# Patient Record
Sex: Female | Born: 1940 | Race: White | Hispanic: No | Smoking: Never smoker
Health system: Southern US, Community
[De-identification: ages and names within clinical notes are randomized; demographics above are authoritative.]

## PROBLEM LIST (undated history)

## (undated) DIAGNOSIS — I10 Essential (primary) hypertension: Secondary | ICD-10-CM

## (undated) HISTORY — PX: ABDOMINAL HYSTERECTOMY: SHX81

## (undated) HISTORY — PX: REPLACEMENT TOTAL KNEE BILATERAL: SUR1225

## (undated) HISTORY — PX: CHOLECYSTECTOMY: SHX55

## (undated) HISTORY — PX: APPENDECTOMY: SHX54

---

## 2010-02-24 ENCOUNTER — Emergency Department (HOSPITAL_BASED_OUTPATIENT_CLINIC_OR_DEPARTMENT_OTHER)
Admission: EM | Admit: 2010-02-24 | Discharge: 2010-02-24 | Payer: Self-pay | Source: Home / Self Care | Admitting: Emergency Medicine

## 2010-05-31 LAB — CBC
MCH: 30 pg (ref 26.0–34.0)
RBC: 4.36 MIL/uL (ref 3.87–5.11)

## 2010-05-31 LAB — BASIC METABOLIC PANEL
BUN: 15 mg/dL (ref 6–23)
Calcium: 9.1 mg/dL (ref 8.4–10.5)
Chloride: 106 mEq/L (ref 96–112)
GFR calc Af Amer: >60 mL/min (ref >60)
Glucose, Bld: 132 mg/dL — ABNORMAL HIGH (ref 70–99)
Potassium: 4 mEq/L (ref 3.5–5.1)
Sodium: 144 mEq/L (ref 135–145)

## 2021-03-20 ENCOUNTER — Other Ambulatory Visit: Payer: Self-pay

## 2021-03-20 ENCOUNTER — Emergency Department (HOSPITAL_BASED_OUTPATIENT_CLINIC_OR_DEPARTMENT_OTHER)
Admission: EM | Admit: 2021-03-20 | Discharge: 2021-03-20 | Disposition: A | Discharge status: Home / Self Care | Payer: Medicare HMO | Attending: Emergency Medicine | Admitting: Emergency Medicine

## 2021-03-20 ENCOUNTER — Encounter (HOSPITAL_BASED_OUTPATIENT_CLINIC_OR_DEPARTMENT_OTHER): Payer: Self-pay | Admitting: *Deleted

## 2021-03-20 ENCOUNTER — Emergency Department (HOSPITAL_BASED_OUTPATIENT_CLINIC_OR_DEPARTMENT_OTHER): Payer: Medicare HMO

## 2021-03-20 DIAGNOSIS — M545 Low back pain, unspecified: Secondary | ICD-10-CM | POA: Diagnosis present

## 2021-03-20 DIAGNOSIS — M48061 Spinal stenosis, lumbar region without neurogenic claudication: Secondary | ICD-10-CM | POA: Insufficient documentation

## 2021-03-20 DIAGNOSIS — M5126 Other intervertebral disc displacement, lumbar region: Secondary | ICD-10-CM | POA: Diagnosis not present

## 2021-03-20 DIAGNOSIS — M5136 Other intervertebral disc degeneration, lumbar region: Secondary | ICD-10-CM | POA: Diagnosis not present

## 2021-03-20 HISTORY — DX: Essential (primary) hypertension: I10

## 2021-03-20 MED ORDER — PREDNISONE 10 MG (21) PO TBPK: ORAL_TABLET | Freq: Every day | ORAL | 0 refills | Status: AC

## 2021-03-20 MED ORDER — TRAMADOL HCL 50 MG PO TABS: 50.0000 mg | ORAL_TABLET | Freq: Four times a day (QID) | ORAL | 0 refills | Status: AC | PRN

## 2021-03-20 MED ORDER — TRAMADOL HCL 50 MG PO TABS
50.0000 mg | ORAL_TABLET | Freq: Once | ORAL | Status: AC
  Administered 2021-03-20: 50 mg via ORAL
  Filled 2021-03-20: qty 1

## 2021-03-20 MED ORDER — PREDNISONE 50 MG PO TABS
60.0000 mg | ORAL_TABLET | Freq: Once | ORAL | Status: AC
  Administered 2021-03-20: 60 mg via ORAL
  Filled 2021-03-20: qty 1

## 2021-03-20 MED ORDER — LIDOCAINE 5 % EX PTCH
1.0000 | MEDICATED_PATCH | CUTANEOUS | Status: DC
  Administered 2021-03-20: 1 via TRANSDERMAL
  Filled 2021-03-20: qty 1

## 2021-03-20 NOTE — ED Notes (Signed)
Pt ambulated to CT with staff

## 2021-03-20 NOTE — ED Triage Notes (Signed)
Pt is here for lower back pain (pain in right buttock with sharp shooting pain down right leg)

## 2021-03-20 NOTE — Discharge Instructions (Signed)
Please pick up medication and take as prescribed. I have also sent home narcotic pain medication to take as needed. I would recommend taking Tylenol for pain and to take the narcotic for breakthrough pain.   You can also buy OTC Salon Pas patches to apply to your back.   Follow up with your PCP for further evaluation of your back pain/findings on CT scan today.   Return to the ED for any new/worsening symptoms

## 2021-03-20 NOTE — ED Provider Notes (Signed)
[facility] EMERGENCY DEPARTMENT Provider Note   CSN: [id number] Arrival date & time: 03/20/21  1232     History  Chief Complaint  Patient presents with   Back Pain    Dana Boyer is a 81 y.o. female who presents to the ED today with complaint of gradual onset, constant, achy, diffuse lower back pain x 1 week.  Patient mentions that about 2 weeks ago she began experiencing some atraumatic right-sided hip pain.  She saw her PCP who did an x-ray without any acute findings and discharge her home with a short course of prednisone which she finished about a week ago.  She states that she is no longer having pain in her hip however now is experiencing pain across her lower back.  She is unsure if it is related to the prednisone use.  She denies any traumas or falls.  She states she has had to use a cane to ambulate as she feels like she could fall at any minute with her back pain.  She does report pain is exacerbated with prolonged sitting and prolonged standing.  She does feel a catching sensation at times with certain movements as well.  She denies any radiation of pain down her legs.  She denies any fevers, chills, urinary retention, urinary or bowel incontinence, saddle anesthesia, any other associated symptoms.  The history is provided by the patient, a relative and medical records.      Home Medications Prior to Admission medications   Medication Sig Start Date End Date Taking? Authorizing Provider  predniSONE (STERAPRED UNI-PAK 21 TAB) 10 MG (21) TBPK tablet Take by mouth daily. Follow package insert 03/20/21  Yes Dairon Procter, PA-C  traMADol (ULTRAM) 50 MG tablet Take 1 tablet (50 mg total) by mouth every 6 (six) hours as needed. 03/20/21  Yes Hyman Hopes, Tyeisha Dinan, PA-C      Allergies    Patient has no known allergies.    Review of Systems   Review of Systems  Constitutional:  Negative for chills and fever.  Genitourinary:  Negative for difficulty urinating, dysuria and  frequency.  Musculoskeletal:  Positive for back pain.  Neurological:  Negative for weakness and numbness.  All other systems reviewed and are negative.  Physical Exam Updated Vital Signs BP (!) 141/91 (BP Location: Left Arm)    Pulse 95    Temp 97.6 F (36.4 C) (Oral)    Resp 16    SpO2 98%  Physical Exam Vitals and nursing note reviewed.  Constitutional:      Appearance: She is not ill-appearing or diaphoretic.  HENT:     Head: Normocephalic and atraumatic.  Eyes:     Conjunctiva/sclera: Conjunctivae normal.  Cardiovascular:     Rate and Rhythm: Normal rate and regular rhythm.     Pulses: Normal pulses.  Pulmonary:     Effort: Pulmonary effort is normal.     Breath sounds: Normal breath sounds. No wheezing, rhonchi or rales.  Abdominal:     Palpations: Abdomen is soft.     Tenderness: There is no abdominal tenderness.  Musculoskeletal:     Cervical back: Neck supple.     Comments: No C or T midline spinal TTP. + Mild midline lumbar spinal TTP with associated left paralumbar musculature TTP. ROM intact to neck and back. Strength 5/5 to BUE and BLEs. Sensation intact throughout. 2+ patellar reflexes bilaterally. Negative SLR bilaterally. 2+ PT pulses bilaterally.   Skin:    General: Skin is warm and dry.  Neurological:     Mental Status: She is alert.    ED Results / Procedures / Treatments   Labs (all labs ordered are listed, but only abnormal results are displayed) Labs Reviewed - No data to display  EKG None  Radiology CT Lumbar Spine Wo Contrast  Result Date: 03/20/2021 CLINICAL DATA:  Low back pain. EXAM: CT LUMBAR SPINE WITHOUT CONTRAST TECHNIQUE: Multidetector CT imaging of the lumbar spine was performed without intravenous contrast administration. Multiplanar CT image reconstructions were also generated. COMPARISON:  None. FINDINGS: Segmentation: There are five lumbar type vertebral bodies. The last full intervertebral disc space is labeled L5-S1. Alignment: Mild  degenerative anterolisthesis of L4. The other vertebral bodies are normally aligned. Moderate to advanced facet disease but no pars defects. Vertebrae: Age related osteoporosis but no bone lesions or fractures. Hypertrophic changes involving the spinous processes. Paraspinal and other soft tissues: No significant paraspinal or retroperitoneal findings. Advanced vascular calcifications but no aneurysm. Disc levels: L1-2: No significant findings. L2-3: No significant findings. L3-4: Diffuse bulging degenerated annulus and central osteophytic spurring along with advanced facet disease and ligamentum flavum thickening contributing to moderate spinal bilateral lateral recess stenosis. There is also a broad-based rim calcified disc protrusion on the right side possibly affecting the right L3 nerve root. L4-5: Severe facet disease, left greater than right and evidence of prior laminectomy. Bulging degenerated annulus and osteophytic spurring contributing to mild bilateral lateral recess stenosis and mild bilateral foraminal stenosis. L5-S1: Severe facet disease, left greater than right. Prior laminectomy. No significant spinal or foraminal stenosis. IMPRESSION: 1. 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 2. Moderate spinal and bilateral lateral recess stenosis at L3-4 along with a broad-based rim calcified disc protrusion on the right side likely affecting the right L3 nerve root. 3. Mild bilateral lateral recess stenosis and mild bilateral foraminal stenosis at L4-5. 4. Prior laminectomy at L4-5 and L5-S1. Aortic Atherosclerosis (ICD10-I70.0). Electronically Signed   By: Rudie Meyer M.D.   On: 03/20/2021 13:31    Procedures Procedures    Medications Ordered in ED Medications  lidocaine (LIDODERM) 5 % 1 patch (1 patch Transdermal Patch Applied 03/20/21 1319)  predniSONE (DELTASONE) tablet 60 mg (has no administration in time range)  traMADol (ULTRAM) tablet  50 mg (50 mg Oral Given 03/20/21 1317)    ED Course/ Medical Decision Making/ A&P                           Medical Decision Making 59 female who presents to the ED today with complaint of atraumatic lower back pain for the past week.  Recently treated for atraumatic right hip pain with course of steroids that she finished a week ago.  On arrival to the ED today vitals are stable.  Patient appears to be no acute distress.  She is sitting upright in bed talking to family member on exam.  She is noted to have some midline lumbar spinal tenderness palpation with associated left paralumbar musculature tenderness palpation however states that at times the pain is diffusely across her lower back.  She denies any radiation of pain.  She is neurovascularly intact throughout.  She has negative straight leg raise bilaterally.  She has 2+ PT pulses bilaterally and 2+ patellar reflexes bilaterally.  She has no red flag symptoms today concerning for cauda equina, spinal epidural abscess, AAA.  Given age and increased risk fracture we will plan to start with CT  lumbar spine for further evaluation with potential for possible compression fracture versus herniated disc versus other finding.  We will treat with Lidoderm patch and tramadol and reevaluate patient.  We unfortunately do not have MRI here at this time however given mild symptoms I have lower suspicion that patient needs MRI today.  Her symptoms do appear more musculature in nature.  Question if she is having some muscle soreness related to her recent right hip pain.   Pt to be discharged home with additional course of prednisone, tramadol PRN for pain, and PCP follow up. Pt and daughter are in agreement with plan and stable for discharge home. Discussed with attending physician Dr. Charm BargesButler who agrees with plan.   Amount and/or Complexity of Data Reviewed Radiology: ordered.    Details: CT scan positive for DDD, stenosis, and herniated disc. Given reassuring  physical exam I do not feel pt requires additional imaging at this time.           Final Clinical Impression(s) / ED Diagnoses Final diagnoses:  Acute bilateral low back pain without sciatica  DDD (degenerative disc disease), lumbar  Spinal stenosis of lumbar region without neurogenic claudication  Lumbar herniated disc    Rx / DC Orders ED Discharge Orders          Ordered    predniSONE (STERAPRED UNI-PAK 21 TAB) 10 MG (21) TBPK tablet  Daily        03/20/21 1357    traMADol (ULTRAM) 50 MG tablet  Every 6 hours PRN        03/20/21 1357             Discharge Instructions      Please pick up medication and take as prescribed. I have also sent home narcotic pain medication to take as needed. I would recommend taking Tylenol for pain and to take the narcotic for breakthrough pain.   You can also buy OTC Salon Pas patches to apply to your back.   Follow up with your PCP for further evaluation of your back pain/findings on CT scan today.   Return to the ED for any new/worsening symptoms       Tanda RockersVenter, Siniyah Evangelist, Cordelia Poche-C 03/20/21 1358    Terrilee FilesButler, Michael C, MD 03/20/21 [phone]

## 2022-11-07 IMAGING — CT CT L SPINE W/O CM
3 of 5 series · 11 of 35 positions shown, 13 images · non-contrast
Comparison: None.

CLINICAL DATA: Low back pain.

EXAM:
CT LUMBAR SPINE WITHOUT CONTRAST
TECHNIQUE: Multidetector CT imaging of the lumbar spine was performed without
intravenous contrast administration. Multiplanar CT image
reconstructions were also generated.

[Series 7: coronal bone · coronal · 0.45mm/px · 3 of 91 slices shown]
[im 19/91  bone]
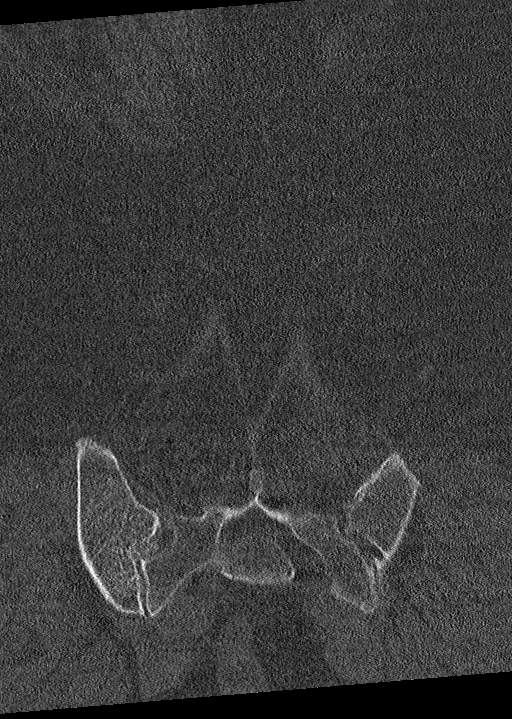
[im 37/91  bone]
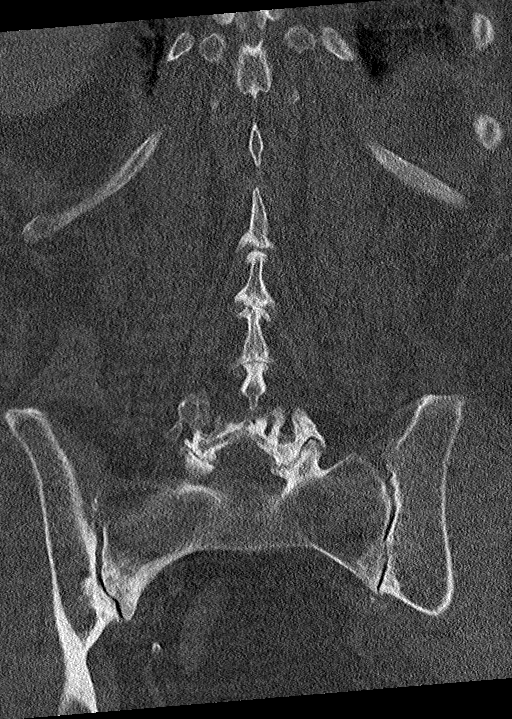
[im 55/91  bone]
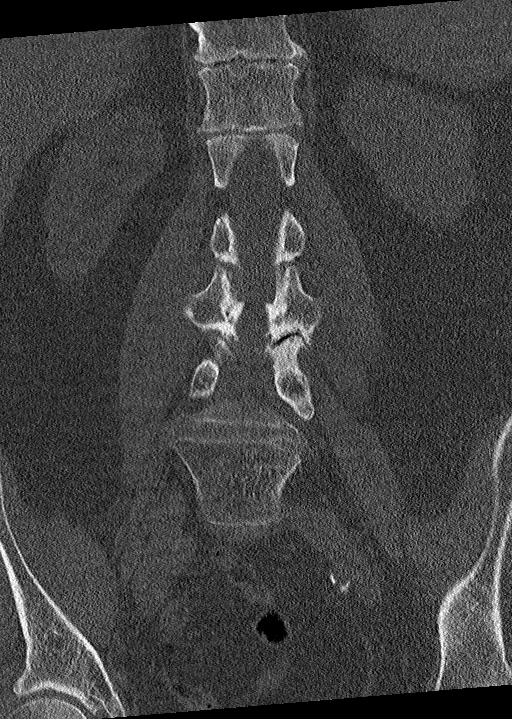

[Series 8: sagittal soft · sagittal · 0.45mm/px · 5 of 87 slices shown, 6 images]
[im 29/87  bone]
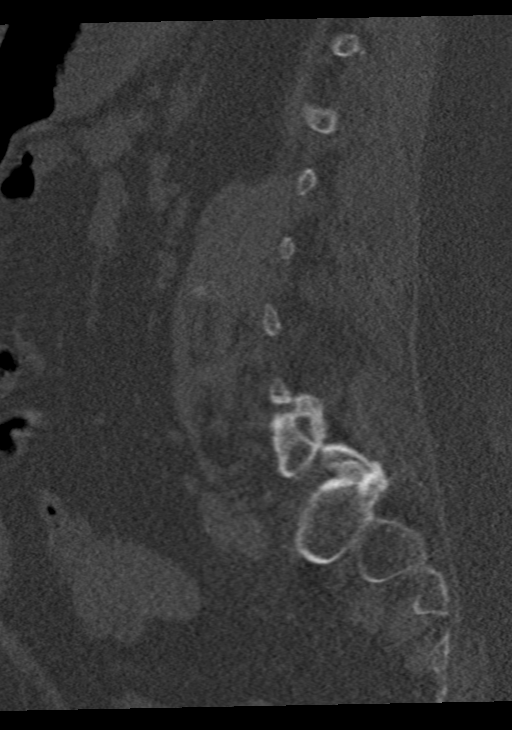
[im 36/87  bone]
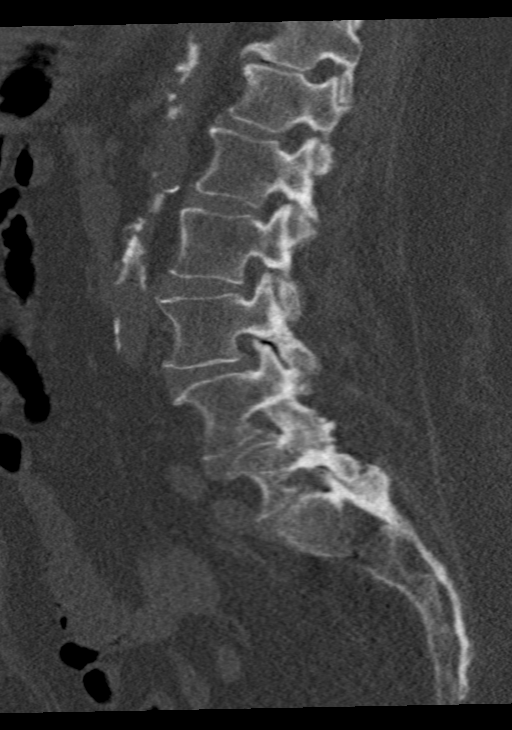
[im 44/87  soft-tissue]
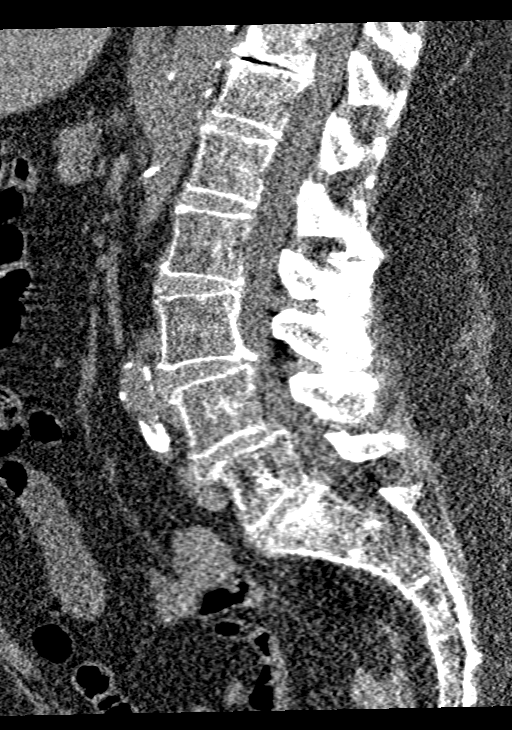
[im 44/87  bone]
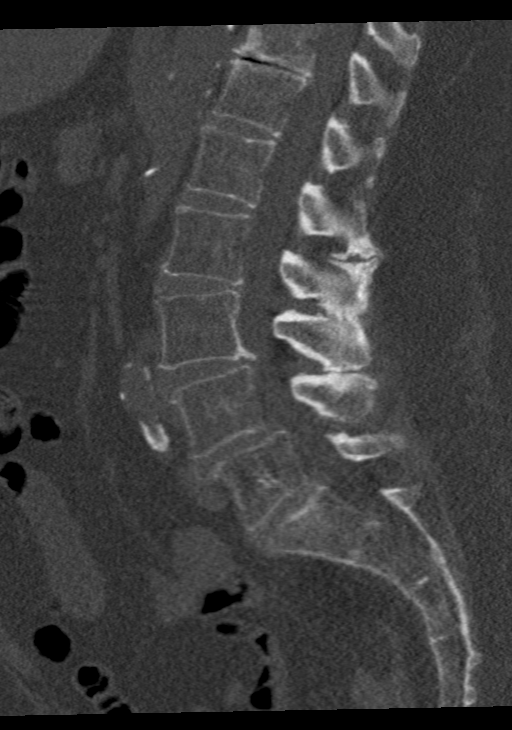
[im 51/87  bone]
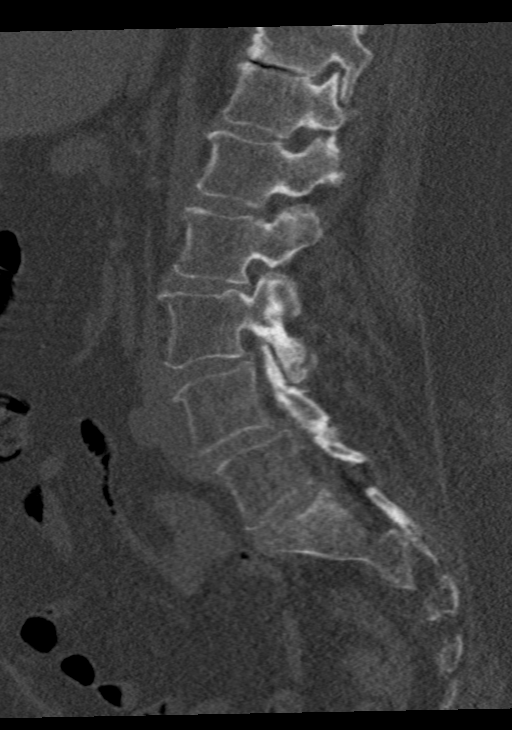
[im 58/87  bone]
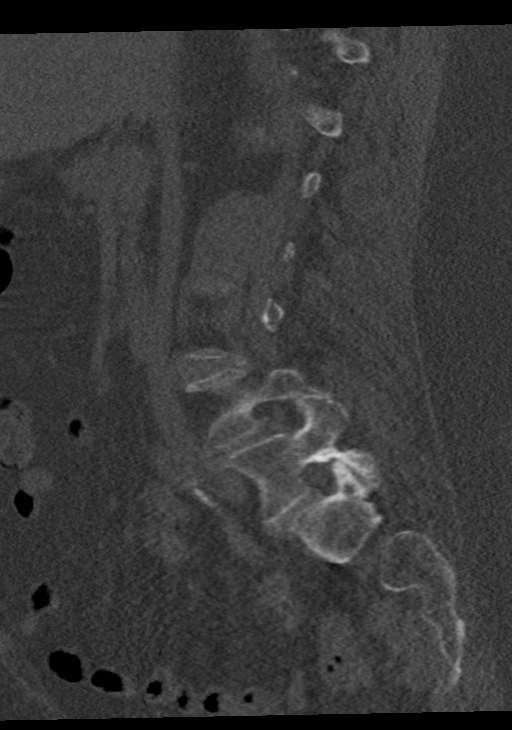

[Series 9: orthogonal axials bone · axial · 0.30mm/px · z∈[-170,-20]mm · 3 of 159 slices shown, 4 images]
[im 40/159  soft-tissue]
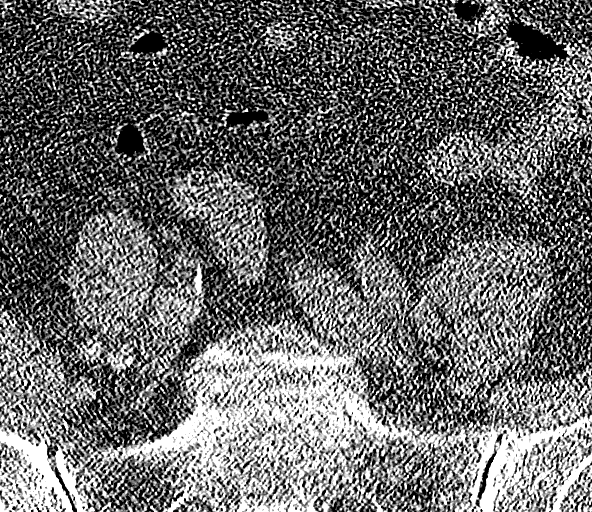
[im 40/159  bone]
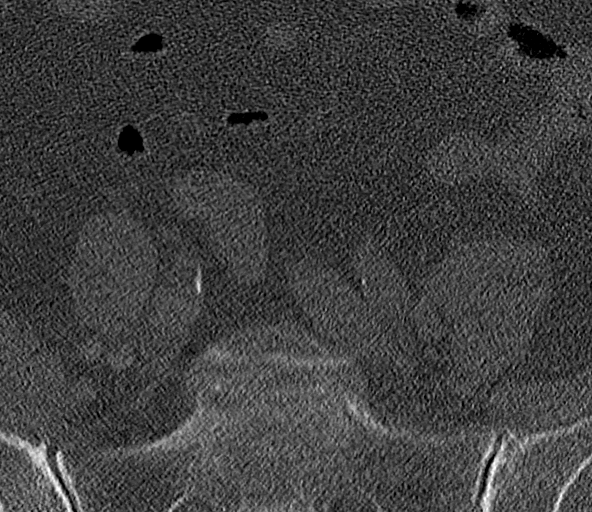
[im 80/159  bone]
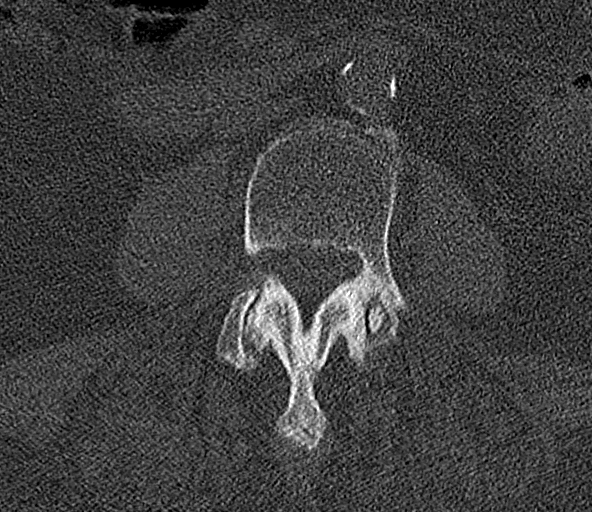
[im 119/159  bone]
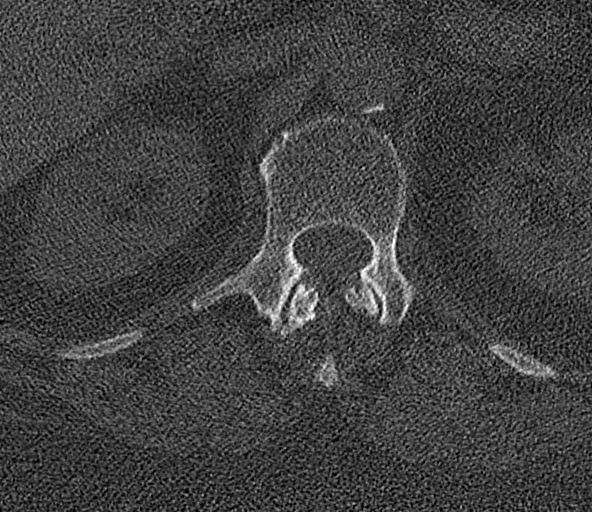

[11 of 35 positions shown; findings below may reference images not displayed]

FINDINGS: Segmentation: There are five lumbar type vertebral bodies. The last
full intervertebral disc space is labeled L5-S1.

Alignment: Mild degenerative anterolisthesis of L4. The other
vertebral bodies are normally aligned. Moderate to advanced facet
disease but no pars defects.

Vertebrae: Age related osteoporosis but no bone lesions or
fractures. Hypertrophic changes involving the spinous processes.

Paraspinal and other soft tissues: No significant paraspinal or
retroperitoneal findings. Advanced vascular calcifications but no
aneurysm.

Disc levels: L1-2: No significant findings.

L2-3: No significant findings.

L3-4: Diffuse bulging degenerated annulus and central osteophytic
spurring along with advanced facet disease and ligamentum flavum
thickening contributing to moderate spinal bilateral lateral recess
stenosis. There is also a broad-based rim calcified disc protrusion
on the right side possibly affecting the right L3 nerve root.

L4-5: Severe facet disease, left greater than right and evidence of
prior laminectomy. Bulging degenerated annulus and osteophytic
spurring contributing to mild bilateral lateral recess stenosis and
mild bilateral foraminal stenosis.

L5-S1: Severe facet disease, left greater than right. Prior
laminectomy. No significant spinal or foraminal stenosis.
IMPRESSION: 1. Advanced degenerative lumbar spondylosis with multilevel disc
disease and advanced facet disease.
2. Moderate spinal and bilateral lateral recess stenosis at L3-4
along with a broad-based rim calcified disc protrusion on the right
side likely affecting the right L3 nerve root.
3. Mild bilateral lateral recess stenosis and mild bilateral
foraminal stenosis at L4-5.
4. Prior laminectomy at L4-5 and L5-S1.

Aortic Atherosclerosis ([id number].O).
